# Patient Record
Sex: Male | Born: 2000 | Hispanic: No | Marital: Single | Smoking: Never smoker
Health system: Southern US, Community
[De-identification: ages and names within clinical notes are randomized; demographics above are authoritative.]

## PROBLEM LIST (undated history)

## (undated) DIAGNOSIS — Q741 Congenital malformation of knee: Secondary | ICD-10-CM

## (undated) DIAGNOSIS — R609 Edema, unspecified: Secondary | ICD-10-CM

## (undated) DIAGNOSIS — R58 Hemorrhage, not elsewhere classified: Secondary | ICD-10-CM

## (undated) DIAGNOSIS — R52 Pain, unspecified: Secondary | ICD-10-CM

## (undated) DIAGNOSIS — S8002XA Contusion of left knee, initial encounter: Secondary | ICD-10-CM

## (undated) HISTORY — DX: Congenital malformation of knee: Q74.1

## (undated) HISTORY — DX: Hemorrhage, not elsewhere classified: R58

## (undated) HISTORY — DX: Contusion of left knee, initial encounter: S80.02XA

## (undated) HISTORY — DX: Pain, unspecified: R52

## (undated) HISTORY — DX: Edema, unspecified: R60.9

---
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
## 2019-01-05 IMAGING — MR MR MRA EXTREM LOWER WO CM*L*
5 of 10 series · 12 of 40 positions shown · non-contrast
Comparison: None available

CLINICAL DATA: AV (congenital arteriovenous malformation)..Left
peripatellar skin lesion and bleeding Left knee raised purplish skin
lesion. He reports it has been present since birth and gotten bigger
over time. He has had multiple bleeding episodes and occasional he
gets sharp shooting pains. He Klpigbb pain with ambulation and
mobility difficulties.

EXAM:
MR MRA OF THE LEFT LOWER EXTREMITY WITHOUT CONTRAST
TECHNIQUE: Angiographic images of the extremity were obtained using MRA
technique without intravenous contrast.

[Series 5001: qiss_trufi_tra_fast_hr_ecg · axial · left · 3.0mm · 0.50mm/px · z∈[+30,+147]mm · 3 of 50 slices shown (1 of 5)]
[im 1/50]
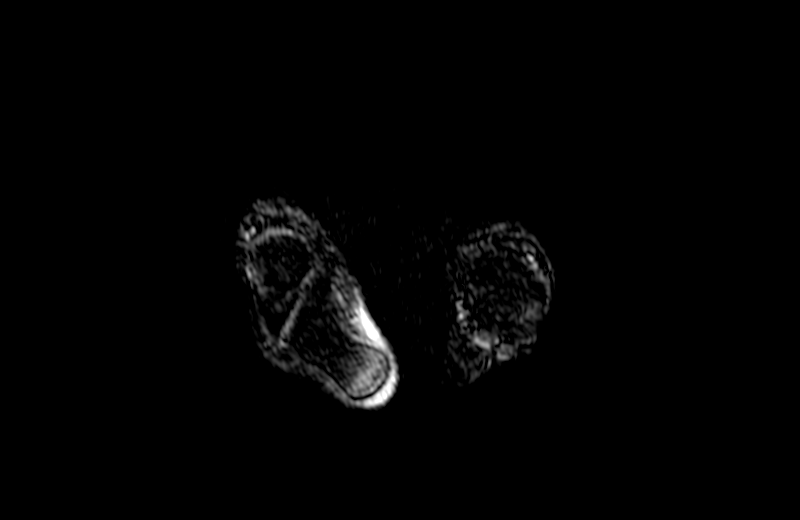
[im 25/50]
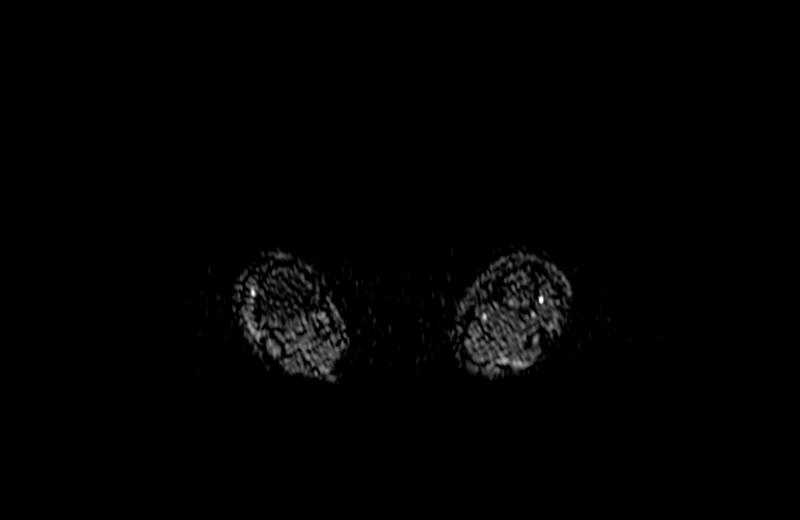
[im 50/50]
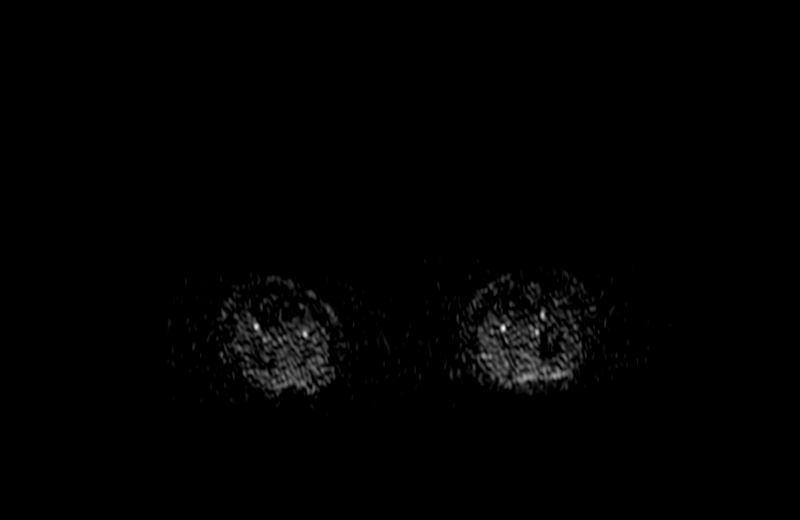

[Series 6001: qiss_trufi_tra_fast_hr_ecg · axial · left · 3.0mm · 0.50mm/px · z∈[+150,+268]mm · 3 of 50 slices shown (2 of 5)]
[im 1/50]
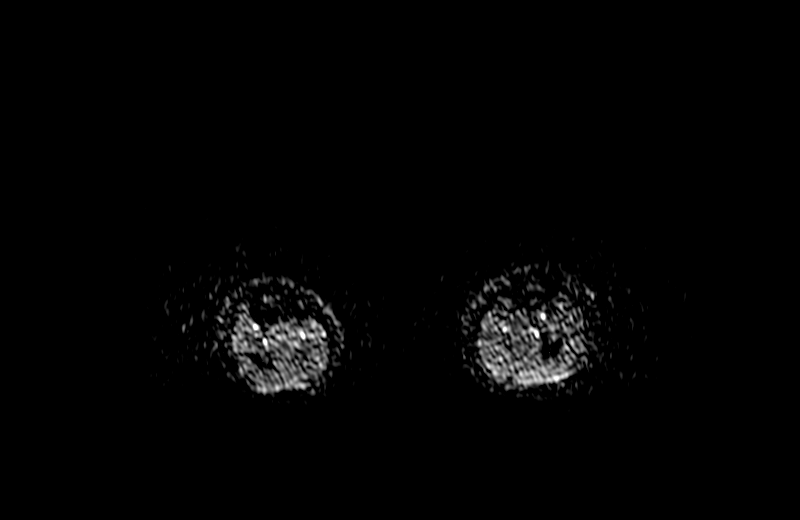
[im 25/50]
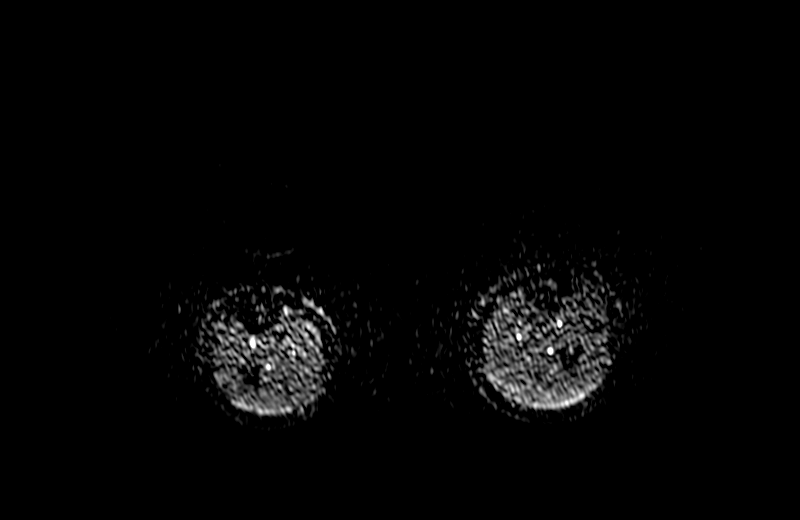
[im 50/50]
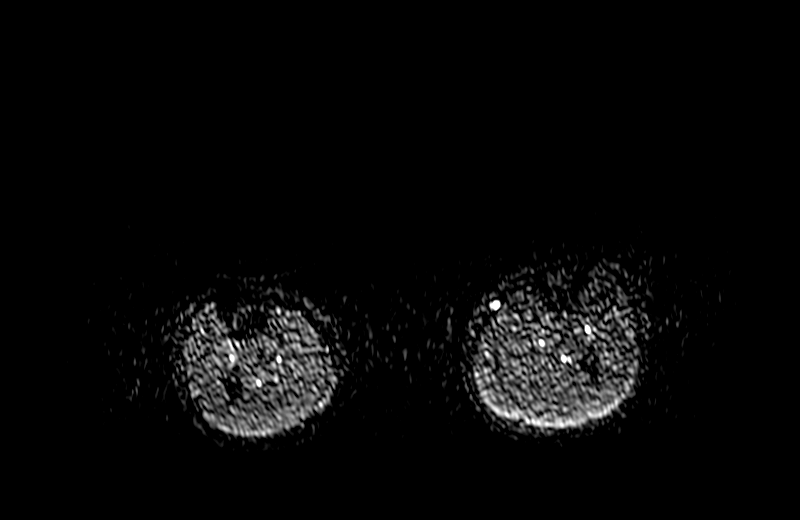

[Series 7001: qiss_trufi_tra_fast_hr_ecg · axial · left · 3.0mm · 0.50mm/px · z∈[+271,+389]mm · 2 of 50 slices shown (3 of 5)]
[im 1/50]
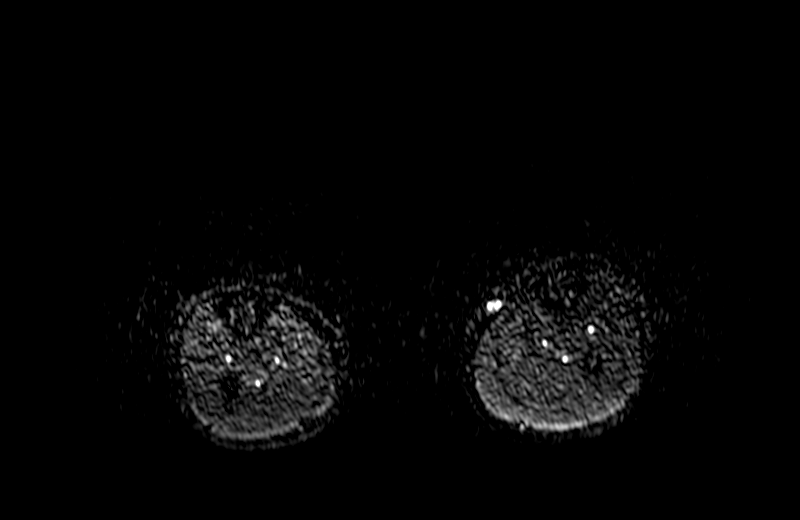
[im 50/50]
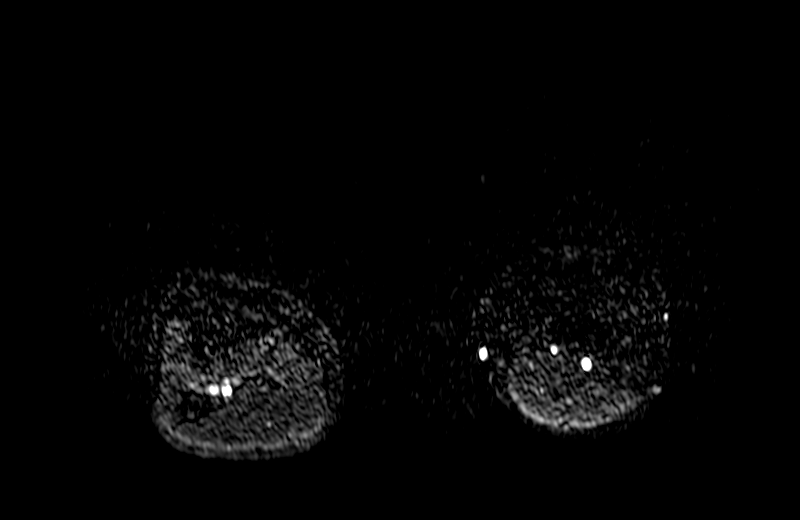

[Series 8001: qiss_trufi_tra_fast_hr_ecg · axial · left · 3.0mm · 0.50mm/px · z∈[+392,+509]mm · 2 of 50 slices shown (4 of 5)]
[im 1/50]
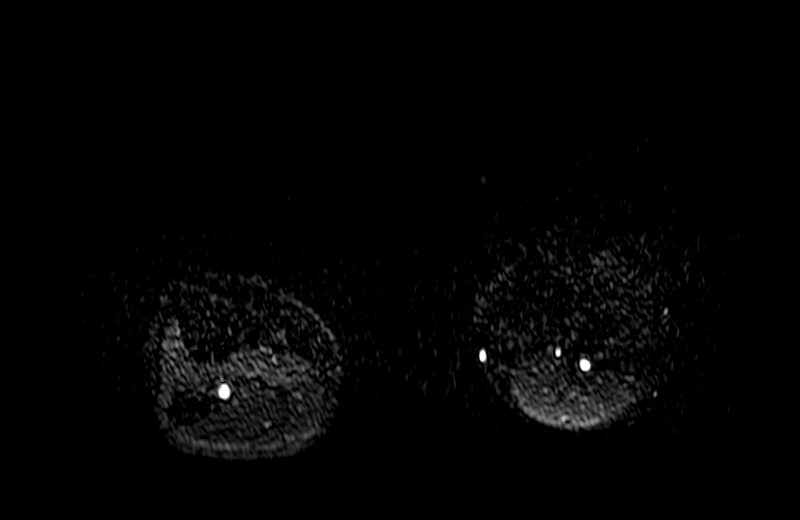
[im 50/50]
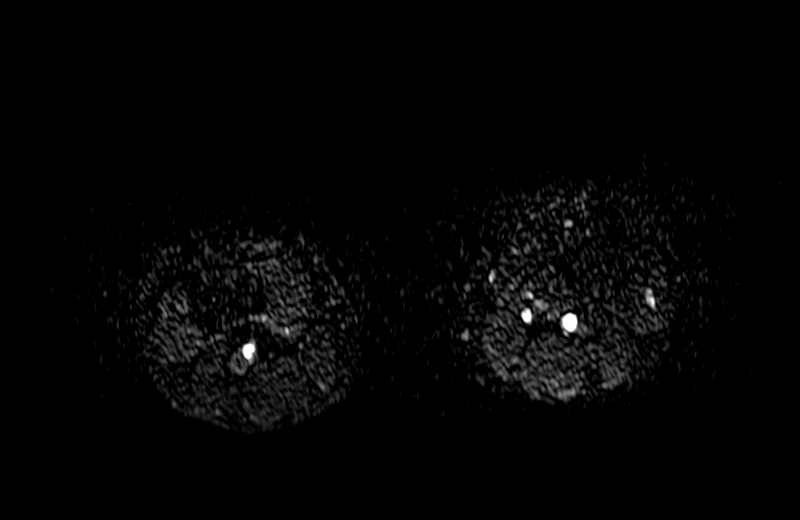

[Series 9001: qiss_trufi_tra_fast_hr_ecg · axial · left · 3.0mm · 0.50mm/px · z∈[+512,+630]mm · 2 of 50 slices shown (5 of 5)]
[im 1/50]
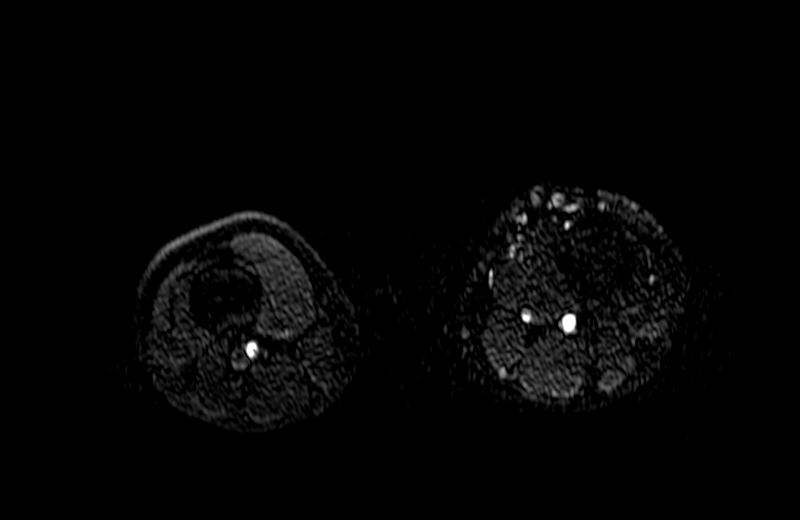
[im 50/50]
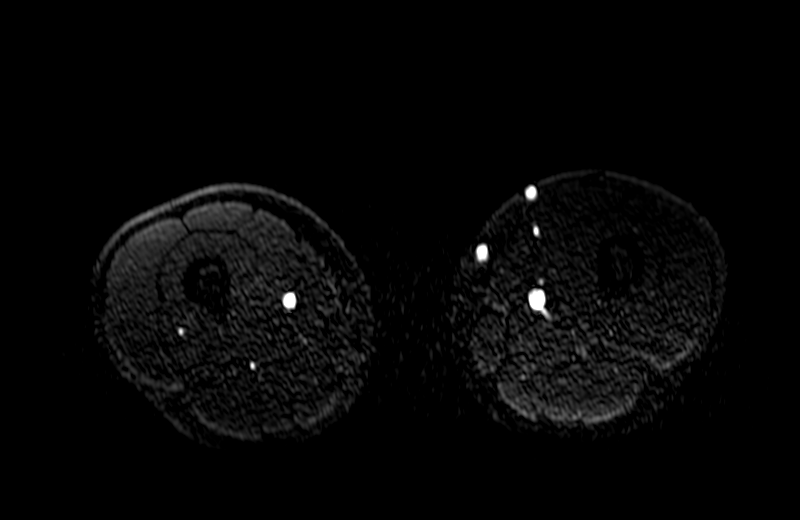

[12 of 40 positions shown; findings below may reference images not displayed]

FINDINGS: Distal  abdominal aorta unremarkable.

Widely patent bilateral iliac arterial systems, ectatic on the left
compared to right.

Left common femoral, superficial femoral, and popliteal arteries
widely patent.

There is a high-flow AV malformation in the left lower extremity
anteriorly at the level of the knee. At least 6 enlarged arterial
branches from the distal SFA and popliteal artery supply the lesion.
Drainage is through the superficial venous system into the left
great saphenous vein.

Patent 3 vessel tibial runoff to the left foot and ankle.

No significant vascular findings in the right lower extremity.
IMPRESSION: 1. High-flow AV malformation at the level of the left knee
anteriorly with supply from multiple branches of the distal SFA and
popliteal artery, drainage via the great saphenous vein.

## 2020-07-27 ENCOUNTER — Ambulatory Visit: Payer: PRIVATE HEALTH INSURANCE

## 2020-07-28 ENCOUNTER — Ambulatory Visit: Payer: PRIVATE HEALTH INSURANCE | Attending: Student in an Organized Health Care Education/Training Program

## 2020-07-28 DIAGNOSIS — M25512 Pain in left shoulder: Secondary | ICD-10-CM

## 2020-07-28 DIAGNOSIS — G8929 Other chronic pain: Secondary | ICD-10-CM

## 2020-07-29 NOTE — Progress Notes
PATIENTBobbyjoe Russell  MRN: [id number]  DOB: 12-Mar-2001  DATE OF SERVICE: 07/28/2020    CHIEF COMPLAINT:   Chief Complaint   Patient presents with   ??? Shoulder Pain     left shoulder pain        HPI   Alan Russell is a 20 y.o. male who  has a past medical history of Acne and Vascular malformation. who presents for   Chief Complaint   Patient presents with   ??? 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    MEDS     Outpatient Medications Prior to Visit   Medication Sig   ??? mupirocin 2% ointment Apply topically two (2) times daily Apply to open sore then apply bandage to left knee. (Patient not taking: Reported on 01/24/2018.)   ??? timolol 0.5% ophthalmic solution 2 drops two times a day to hemangioma.. (Patient not taking: Reported on 01/24/2018.)     No facility-administered medications prior to visit.       PHYSICAL EXAM      Last Recorded Vital Signs:    07/28/20 1652   BP: 125/75   Pulse: 71   Temp: 36.3 ???C (97.3 ???F)   SpO2: 100%     Body mass index is 20.16 kg/m???.  Wt Readings from Last 3 Encounters:   07/28/20 132 lb 9.6 oz (60.1 kg)   01/24/18 131 lb (59.4 kg) (23 %, Z= -0.72)*     * Growth percentiles are based on CDC (Boys, 2-20 Years) data.        Physical Exam  Vitals and nursing note reviewed.   Constitutional:       General: He is not in acute distress.     Appearance: Normal appearance. He is normal weight. He is not ill-appearing, toxic-appearing or diaphoretic. HENT:      Head: Normocephalic and atraumatic.   Cardiovascular:      Rate and Rhythm: Normal rate and regular rhythm.      Pulses: Normal pulses.      Heart sounds: Normal heart sounds. No murmur heard.  No friction rub. No gallop.    Pulmonary:      Effort: Pulmonary effort is normal. No respiratory distress.      Breath sounds: Normal breath sounds. No stridor. No wheezing, rhonchi or rales.   Abdominal:      General: Abdomen is flat. There is no distension.      Palpations: Abdomen is soft. There is no mass.      Tenderness: There is no abdominal tenderness. There is no guarding or rebound.      Hernia: No hernia is present.   Musculoskeletal:      Comments: No ttp over cervical/thoracic/lumbar spinous processes  No ttp  over paraspinal muscles in cervical region  Normal cervical ROM  Normal shoulder ROM  5/5 muscle strength b/l UE   Negative hawkings sign  Negative empty can sign   Sensation intact bilateral   Neurological:      General: No focal deficit present.      Mental Status: He is alert. Mental status is at baseline.      Cranial Nerves: No cranial nerve deficit.   Psychiatric:         Mood and Affect: Mood normal.         Behavior: Behavior normal.         Thought Content: Thought content normal.         Judgment: Judgment normal.           LABS/STUDIES   I have:   [x]  Reviewed/ordered [x]  1 []  2 []  ? 3 unique laboratory, radiology, and/or diagnostic tests noted below    []  Reviewed []  1 []  2 []  ? 3 prior external notes and incorporated into patient assessment    []  Discussed management or test interpretation with external provider(s) as noted       Lab Studies:  N/A    Imaging Studies:   N/A    A&P   Alan Russell is a 20 y.o. male presenting for   Chief Complaint   Patient presents with   ??? Shoulder Pain     left shoulder pain         PROBLEM & ORDERS    ICD-10-CM    1. Chronic left shoulder pain  M25.512 XR shoulder ap int-ext left (2 views)    G89.29 Referral to Rehabilitation, Physical Therapy CANCELED: Referral to Rehabilitation, Physical Therapy     CANCELED: Referral to Rehabilitation, Physical Therapy       ASSESSMENT    1. Chronic left shoulder pain  - likely muscular strain, normal PE  - will get xrays because of chronicity  - start physical therapy, he is heading back to college in a week in [place]   - if no improvement will get mri   - RTO over the summer for annual  - heating pad, advil/tylenol prn        The above recommendation were discussed with the patient.  The patient has all questions answered satisfactorily and is in agreement with this recommended plan of care.        No follow-ups on file.     Doran Durand, DO  Family Medicine  Clinical Instructor  Department of Medicine  07/28/2020 5:08 PM

## 2021-01-18 ENCOUNTER — Ambulatory Visit: Payer: PRIVATE HEALTH INSURANCE

## 2021-01-18 DIAGNOSIS — Z0000 Encounter for general adult medical examination without abnormal findings: Secondary | ICD-10-CM

## 2021-01-18 DIAGNOSIS — G8929 Other chronic pain: Secondary | ICD-10-CM

## 2021-01-18 DIAGNOSIS — M25511 Pain in right shoulder: Secondary | ICD-10-CM

## 2021-01-18 DIAGNOSIS — Z23 Encounter for immunization: Secondary | ICD-10-CM

## 2021-01-18 NOTE — Progress Notes
PATIENTFread Russell   MRN:  [id number]  DOB:  Mar 23, 2001  DATE OF SERVICE:  01/18/2021    PRIMARY CARE PROVIDER: Chana Bode, MD    Chief Complaint   Patient presents with   ? Annual Exam     Subjective:     Alan Russell is a a 20 y.o. 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 Acne    ? Vascular malformation       No past surgical history on file.   Patient has no known allergies.  No outpatient medications have been marked as taking for the 01/18/21 encounter (Office Visit) with Chana Bode, MD.     No family history on file.  Social History     Socioeconomic History   ? Marital status: Single   Tobacco Use   ? Smoking status: Never Smoker   ? Smokeless tobacco: Never Used   Vaping Use   ? Vaping Use: Former   ? Quit date: 10/23/2020   Substance and Sexual Activity   ? Alcohol use: Yes     Comment: social   ? Drug use: Not Currently     Types: Marijuana       Review of Systems:  14 point ROS was performed and pertinent positives and negatives are noted in the history of present illness.     Objective:     PHYSICAL EXAM:    BP 127/80  ~ Pulse 68  ~ Temp 36.2 ?C (97.1 ?F) (Forehead)  ~ Ht 5' 8'' (1.727 m)  ~ Wt 132 lb 6.4 oz (60.1 kg)  ~ SpO2 100%  ~ BMI 20.13 kg/m?       Gen -  NAD   HEENT - NC/AT, PERRL/EOMI, mmm, OP clear   Neck - supple   CV - RRR, normal S1S2, no murmurs   Lungs - CTAB   Abd - soft NT/ND+BS, no HSM, no masses   Extr - no c/c/e   Neuro - A&O x 4, nonfocal  Skin over area of flap of the left medial thigh is intact and well healed          Lab Review:    No results found for: WBC, HGB, HCT, MCV, PLT  No results found for: CREAT, BUN, NA, K, CL, CO2  No results found for: ALT, AST, GGT, ALKPHOS, BILITOT  No results found for: CHOL, CHOLDLCAL, TRIGLY  No results found for: TSH  No results found for: HGBA1C      IMAGING:      Assessment & Plan:     Diagnoses and all orders for this visit:        Well adult exam  -     CBC & Auto Differential; Future  -     Lipid Panel; Future  -  TSH with reflex FT4, FT3; Future  -     Hgb A1c; Future  -     Vitamin D,25-Hydroxy; Future  -     Hepatic Funct Panel; Future  -     HBS Antigen; Future  -     HCV Antibody Screen with Reflex to Quantitative PCR/Genotype; Future  -     HIV-1/2 Ag/Ab 4th Generation with Reflex Confirmation; Future  -     Basic Metabolic Panel; Future      Chronic right shoulder pain  -     XR shoulder ap int-ext+axillary right (3 views); Future  Supportive care discussed     Need for HPV vaccine  -     HPV 9-valent vaccine IM (Gardasil 9) (PF) SYR/SDV (30-43 years of age) (shared decision making for 18-65 years of age) [SINGLE ORDER, FOR ADMIN TODAY IN-OFFICE]  -     HPV 9-valent vaccine IM (Gardasil 9) (PF) SYR/SDV (14-75 years of age) (shared decision making for 8-10 years of age) Cydney Ok those starting after age 38+ need 3 doses] [FUTURE 2 MONTHS]; Future; Expected date: 03/20/2021  -     HPV 9-valent vaccine IM (Gardasil 9) (PF) SYR/SDV (22-44 years of age) (shared decision making for 82-98 years of age) [FINAL DOSE, FUTURE 6 MONTHS]; Future; Expected date: 07/17/2021      AVM (congenital arteriovenous malformation)  Advised to [facility] care with vascular surgeon          The above plan of care, diagnosis, order, and follow-up were discussed with the patient. Questions related to this recommended plan of care were answered.    Return in about 1 year (around 01/18/2022) for annual exam.        Author: Chana Bode, MD 01/18/2021 6:46 PM            were spent personally by me today on this encounter which include today's pre-visit review of the chart, obtaining appropriate history, performing an evaluation, documentation and discussion of management with details supported within the note for today's visit. The time documented was exclusive of any time spent on the separately billed procedure.

## 2021-01-18 NOTE — Patient Instructions
[facility]  Telephone  [phone] [facility]  [phone] [facility]

## 2021-03-22 ENCOUNTER — Institutional Professional Consult (permissible substitution): Payer: BLUE CROSS/BLUE SHIELD

## 2021-03-22 DIAGNOSIS — Z23 Encounter for immunization: Secondary | ICD-10-CM

## 2021-07-22 ENCOUNTER — Institutional Professional Consult (permissible substitution): Payer: PRIVATE HEALTH INSURANCE

## 2021-07-22 DIAGNOSIS — Z23 Encounter for immunization: Secondary | ICD-10-CM

## 2021-12-09 ENCOUNTER — Ambulatory Visit: Payer: BLUE CROSS/BLUE SHIELD

## 2021-12-09 DIAGNOSIS — R440 Auditory hallucinations: Secondary | ICD-10-CM

## 2021-12-09 NOTE — Progress Notes
PATIENTRoye Russell   MRN:  [id number]  DOB:  01-15-2001  DATE OF SERVICE:  12/09/2021    PRIMARY CARE PROVIDER: Chana Bode, MD    Chief Complaint   Patient presents with   ? hears voices     X 1 month      Subjective:     Alan Russell is a a 21 y.o. male     Hearing voices past 1 month  Had porn addiction since pandemic , then stopped 1 month ago, then above started  Voices :tell pt he is weird , screams   1st time of such sx    Sx occur all day   Occasional alcohol use  Quit MJ use 1 month ago - was using half a joint for years   Takes no meds now   Denies SI/HI  Denies Visual hallucination   Does note stress   Sx Impacting job function   Sleep is good   Never saw mental health professional   No family psych conditions       GAD-7 12/09/2021   Feeling nervous, anxious, or on edge Nearly every day   Not being able to stop or control worrying Several days   Worrying too much about different things Several days   Trouble relaxing Nearly every day   Being so restless that is hard to sit still Not at all   Becoming easily annoyed or irritable Several days   Feeling afraid, as if something awful might happen Not at all   Total Score 9       Total Score Interpretation   ? 10 Possible diagnosis of GAD; confirm by further evaluation   5 Mild anxiety   10 Moderate anxiety   15 Severe anxiety       Most Recent PHQ-9 Score: 17 (12/09/21 1641)    Questionnaire:    Little interest or pleasure in doing things: Several days-Little interest or pleasure in doing things: 1  Feeling down, depressed, or hopeless: Nearly every day-Feeling down, depressed, or hopeless: 3  Trouble falling or staying asleep, or sleeping too much: More than half the days-Trouble falling or staying asleep, or sleeping too much: 2  Feeling tired or having little energy: Nearly every day-Feeling tired or having little energy: 3  Poor appetite or overeating: Nearly every day-Poor appetite or overeating: 3  Feeling bad about yourself - or that you are a failure or have let yourself or your family down: More than half the days-Feeling bad about yourself - or that you are a failure or have let yourself or your family down: 2  Trouble concentrating on things, such as reading the newspaper or watching television: Nearly every day-Trouble concentrating on things, such as reading the newspaper or watching television: 3  Moving or speaking so slowly that other people could have noticed. Or the opposite - being so fidgety or restless that you have been moving around a lot more than usual: Not at all-Moving or speaking so slowly that other people could have noticed. Or the opposite - being so fidgety or restless that you have been moving around a lot more than usual: 0  Thoughts that you would be better off dead, or of hurting yourself in some way: Not at all-Thoughts that you would be better off dead, or of hurting yourself in some way: 0  Total Score: 17        PHQ-9 Score   Depression Severity     Proposed Treatment  Actions    (reference)      0  -  4 None - minimal  ? None     5  -  9  Mild  ? Watchful waiting; repeat PHQ-9 at follow-up    10 - 14  Moderate  ? Treatment plan, considering counseling, follow-up and/or pharmacotherapy    15 - 19  Moderately Severe  ? Active treatment with pharmacotherapy and/or psychotherapy    20 - 27  Severe  ? Immediate initiation of pharmacotherapy and, if severe impairment or poor response to therapy, expedited referral to a mental health specialist for psychotherapy and/or collaborative management          Past Medical History:   Diagnosis Date   ? Acne    ? Vascular malformation       No past surgical history on file.   Patient has no known allergies.  No outpatient medications have been marked as taking for the 12/09/21 encounter (Office Visit) with Chana Bode, MD.     No family history on file.  Social History     Socioeconomic History   ? Marital status: Single   Tobacco Use   ? Smoking status: Never   ? Smokeless tobacco: Never   Vaping Use   ? Vaping Use: Former   ? Quit date: 10/23/2020   Substance and Sexual Activity   ? Alcohol use: Yes     Comment: social   ? Drug use: Not Currently     Types: Marijuana       Review of Systems:  14 point ROS was performed and pertinent positives and negatives are noted in the history of present illness.     Objective:     PHYSICAL EXAM:    BP 115/79  ~ Pulse 72  ~ Temp 36.2 ?C (97.1 ?F) (Forehead)  ~ Ht 5' 8'' (1.727 m)  ~ Wt 135 lb 12.8 oz (61.6 kg)  ~ SpO2 100%  ~ BMI 20.65 kg/m?       Gen -  NAD         Lab Review:    No results found for: WBC, HGB, HCT, MCV, PLT  No results found for: CREAT, BUN, NA, K, CL, CO2  No results found for: ALT, AST, GGT, ALKPHOS, BILITOT  No results found for: CHOL, CHOLDLCAL, TRIGLY  No results found for: TSH  No results found for: HGBA1C      IMAGING:      Assessment & Plan:     Diagnoses and all orders for this visit:    Auditory hallucinations  -     Referral to [facility] [urgent referral placed for Psychiatry]. Also given Mental Health info on VAS     ED precautions given     The above plan of care, diagnosis, order, and follow-up were discussed with the patient. Questions related to this recommended plan of care were answered.    No follow-ups on file.        Author:  Chana Bode, MD 12/09/2021 4:46 PM            29 minutes were spent personally by me today on this encounter which include today's pre-visit review of the chart, obtaining appropriate history, performing an evaluation, documentation and discussion of management with details supported within the note for today's visit. The time documented was exclusive of any time spent on the separately billed procedure.

## 2021-12-09 NOTE — Patient Instructions
[facility]    24 hour help line: [phone]  Option 1 for Service Referrals    [facility]?????????? ????????.???? [phone]  [address] [place], [place] [address]  Hours: Mon-Fri, 9:00am-5:00pm    [facility]??????..??????? [phone]  [address] [place], [place] [address]  Hours: Mon-Fri 8:30am-5:00pm, Walk-ins Mon-Fri 9:00am-4:00pm    [facility]??????????????????????????. [phone]  [address] [place], [place] [address]  Hours: Mon-Fri 9:00am-5:00pm, Walk-ins Mon-Fri 9:00am-5:00pm    Wellness Center  [facility]Adult Outpatient????...??..???..?..??? [phone]  [address] [place], [place] [address]  Hours: Mon-Fri 8:00am-5:00pm    [facility]????????????????????. [phone]  [address], [place], [place] [address]  Hours: Mon-Fri 8:00am-5:00pm.  Must be referred from [facility]    [facility]???...??..????? [phone]  [address], [place], [place] [address]  Hours: Mon, Tue, Thur, Fri, 8:00am-5:00pm, Wed 8:00am-6pm  Walk-ins Mon-Fri 8:00am-5:00pm     [facility]???????????????. [phone]  [address] [place], [place] [address]  Hours: Mon, Tue, & Thur 8:00am-6:00pm, Wed 8:00am-7:00pm,   Fri 8:00am-5:00pm  Walk-ins Mon-Fri 8:00am-12:00pm and 1:00pm-5:00pm

## 2021-12-18 ENCOUNTER — Ambulatory Visit: Payer: BLUE CROSS/BLUE SHIELD

## 2021-12-18 DIAGNOSIS — M7918 Myalgia, other site: Secondary | ICD-10-CM

## 2021-12-18 DIAGNOSIS — M25519 Pain in unspecified shoulder: Secondary | ICD-10-CM

## 2021-12-18 DIAGNOSIS — M25552 Pain in left hip: Secondary | ICD-10-CM

## 2021-12-18 DIAGNOSIS — M542 Cervicalgia: Secondary | ICD-10-CM

## 2021-12-18 MED ORDER — IBUPROFEN 600 MG PO TABS
600 mg | ORAL_TABLET | Freq: Four times a day (QID) | ORAL | 0 refills | Status: AC | PRN
Start: 2021-12-18 — End: ?
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
## 2021-12-18 NOTE — Patient Instructions
Referral - Physical Therapy     [facility] medical [facility] contracted)  [address]  [place], [place] [address]  [phone]    [address]  [place], [place] [address]  [phone]    [facility] medical group contracted)  [address]  [place], [place] [address]  Phone: [phone]  Fax: [phone]    [facility]  [address] [facility] [address]  [place], [place] [address]  Ph: [phone]  Fax: [phone]  Ask for Jonathon Jordan    [facility]  [address]  [place], [place] [address]  Ph: [phone]    [facility]  Address: [address]  [place], [place] [address]  Phone: [phone]  Fax: [phone]    [facility]  Address: [address]  [place], [place] [address]  Phone: [phone]  Fax: [phone]    [facility]  Address: [address]  [place], [place] [address]  Phone: [phone]  Fax: [phone]    [facility]  Address: [phone] [place], [place] [address]  Phone: [phone]  Fax: [phone]    [facility]  [address]  [place], [place] [address]  Ph: [phone]    [facility]  [address]  [place], [place] [address]  Ph: [phone]     Self massage to neck and shoulders as instructed on handout, every day, at least 2 times a day, 1-2 minutes each time. Can perform more as desired.        Perform below neck stretching exercises at least 2 times a day, hold each position for 10 seconds.    Perform below buttock and hip stretching exercises at least 2 times a day, every day, hold each position for 10-20 seconds.    Top row is frontal view. Bottom row is side view.  (Images show stretches for left side. Flip images if addressing right sided symptoms.)

## 2024-05-07 ENCOUNTER — Ambulatory Visit: Payer: PRIVATE HEALTH INSURANCE
